# Patient Record
Sex: Male | Born: 1970 | Race: Black or African American | Hispanic: No | Marital: Married | Smoking: Current every day smoker
Health system: Southern US, Community
[De-identification: ages and names within clinical notes are randomized; demographics above are authoritative.]

---

## 2009-12-06 ENCOUNTER — Emergency Department (HOSPITAL_COMMUNITY): Admission: EM | Admit: 2009-12-06 | Discharge: 2009-12-07 | Payer: Self-pay | Admitting: Emergency Medicine

## 2010-05-17 LAB — POCT I-STAT, CHEM 8
Chloride: 105 mEq/L (ref 96–112)
Creatinine, Ser: 0.9 mg/dL (ref 0.4–1.5)
Glucose, Bld: 95 mg/dL (ref 70–99)
Hemoglobin: 15 g/dL (ref 13.0–17.0)
Potassium: 3.6 mEq/L (ref 3.5–5.1)
Sodium: 142 mEq/L (ref 135–145)

## 2010-05-17 LAB — POCT CARDIAC MARKERS
CKMB, poc: <1 ng/mL — ABNORMAL LOW (ref 1.0–8.0)
Myoglobin, poc: 43.4 ng/mL (ref 12–200)
Troponin i, poc: <0.05 ng/mL (ref 0.00–0.09)

## 2011-03-17 IMAGING — CR DG CHEST 2V
2 series · 2 of 2 positions shown · non-contrast
Comparison: None.

CLINICAL DATA: 39-year-old male with left chest pain.

CHEST - 2 VIEW

[w chest pa]
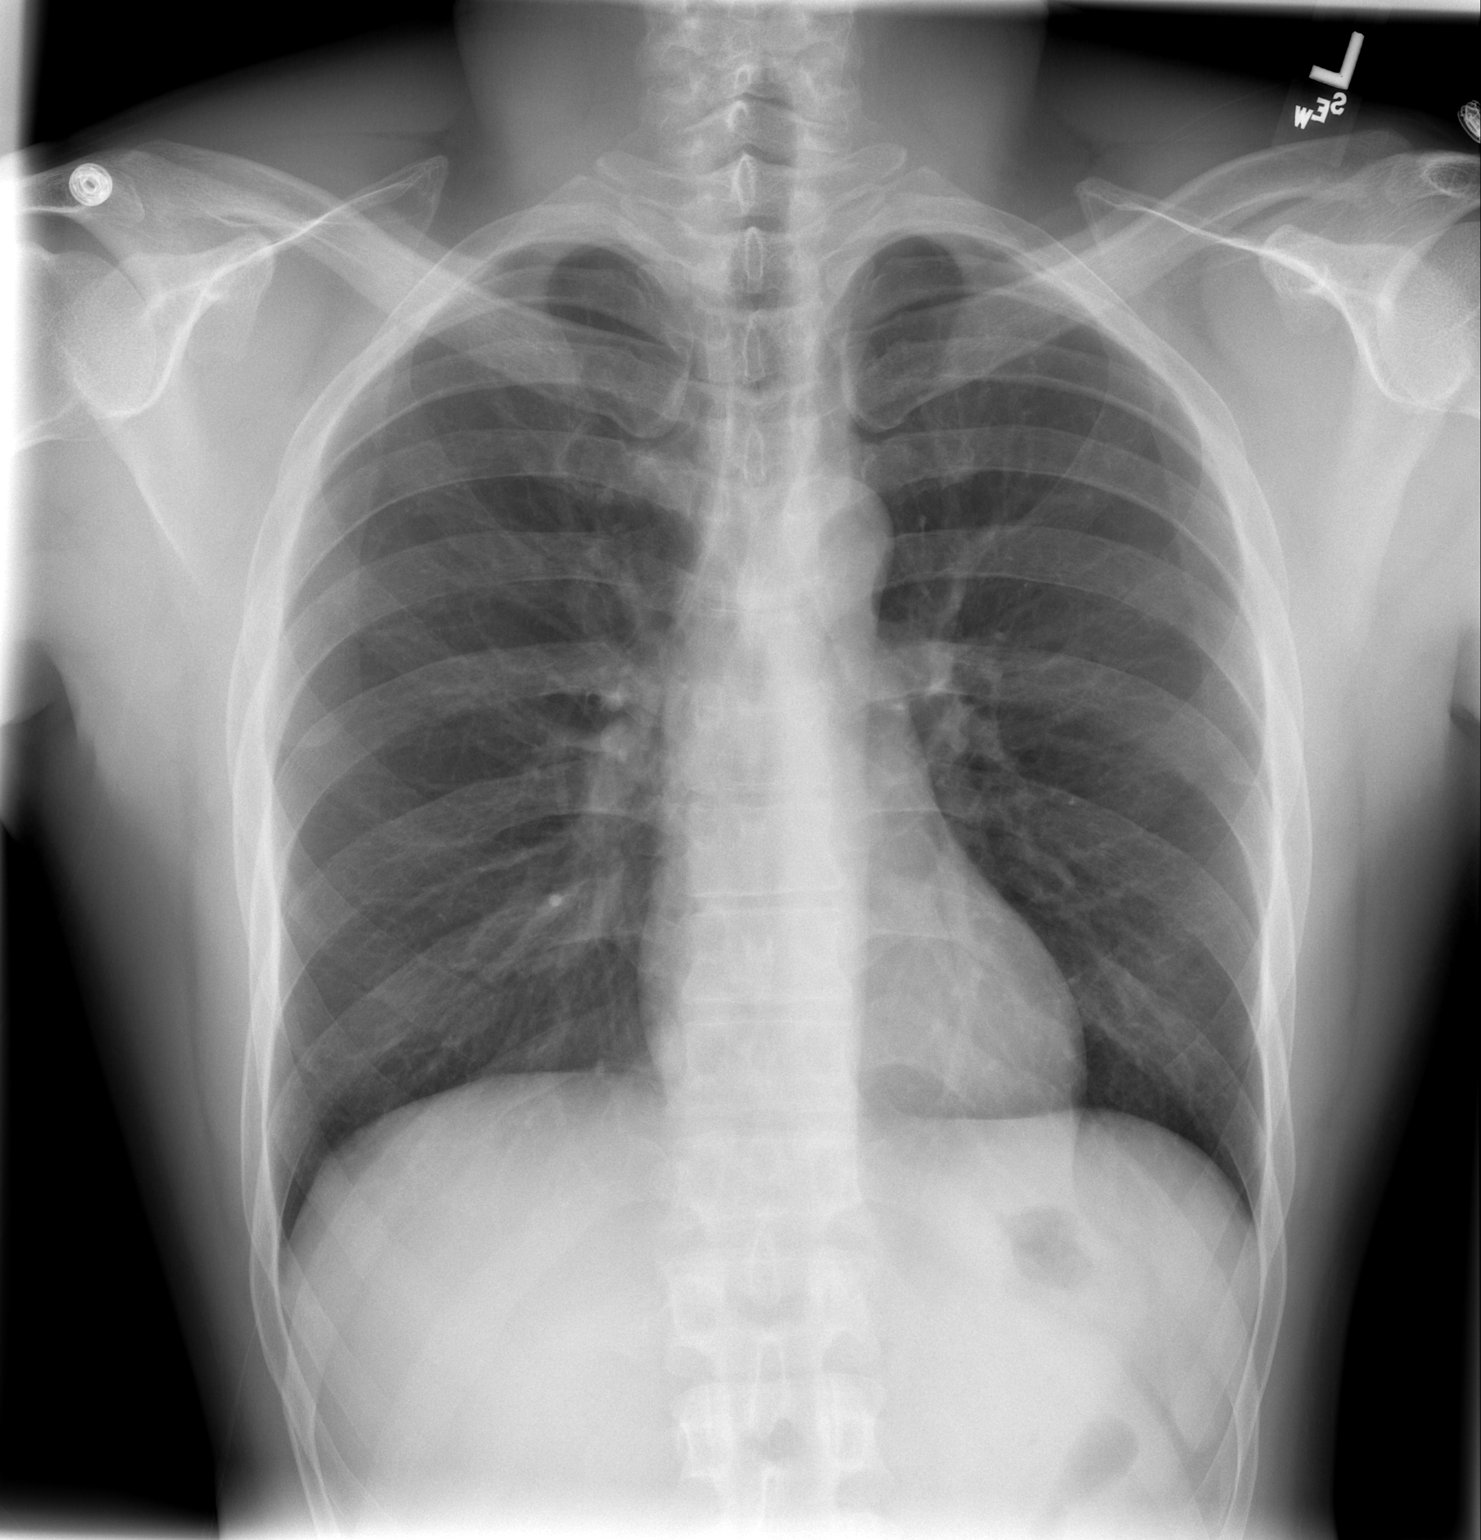

[w chest lat]
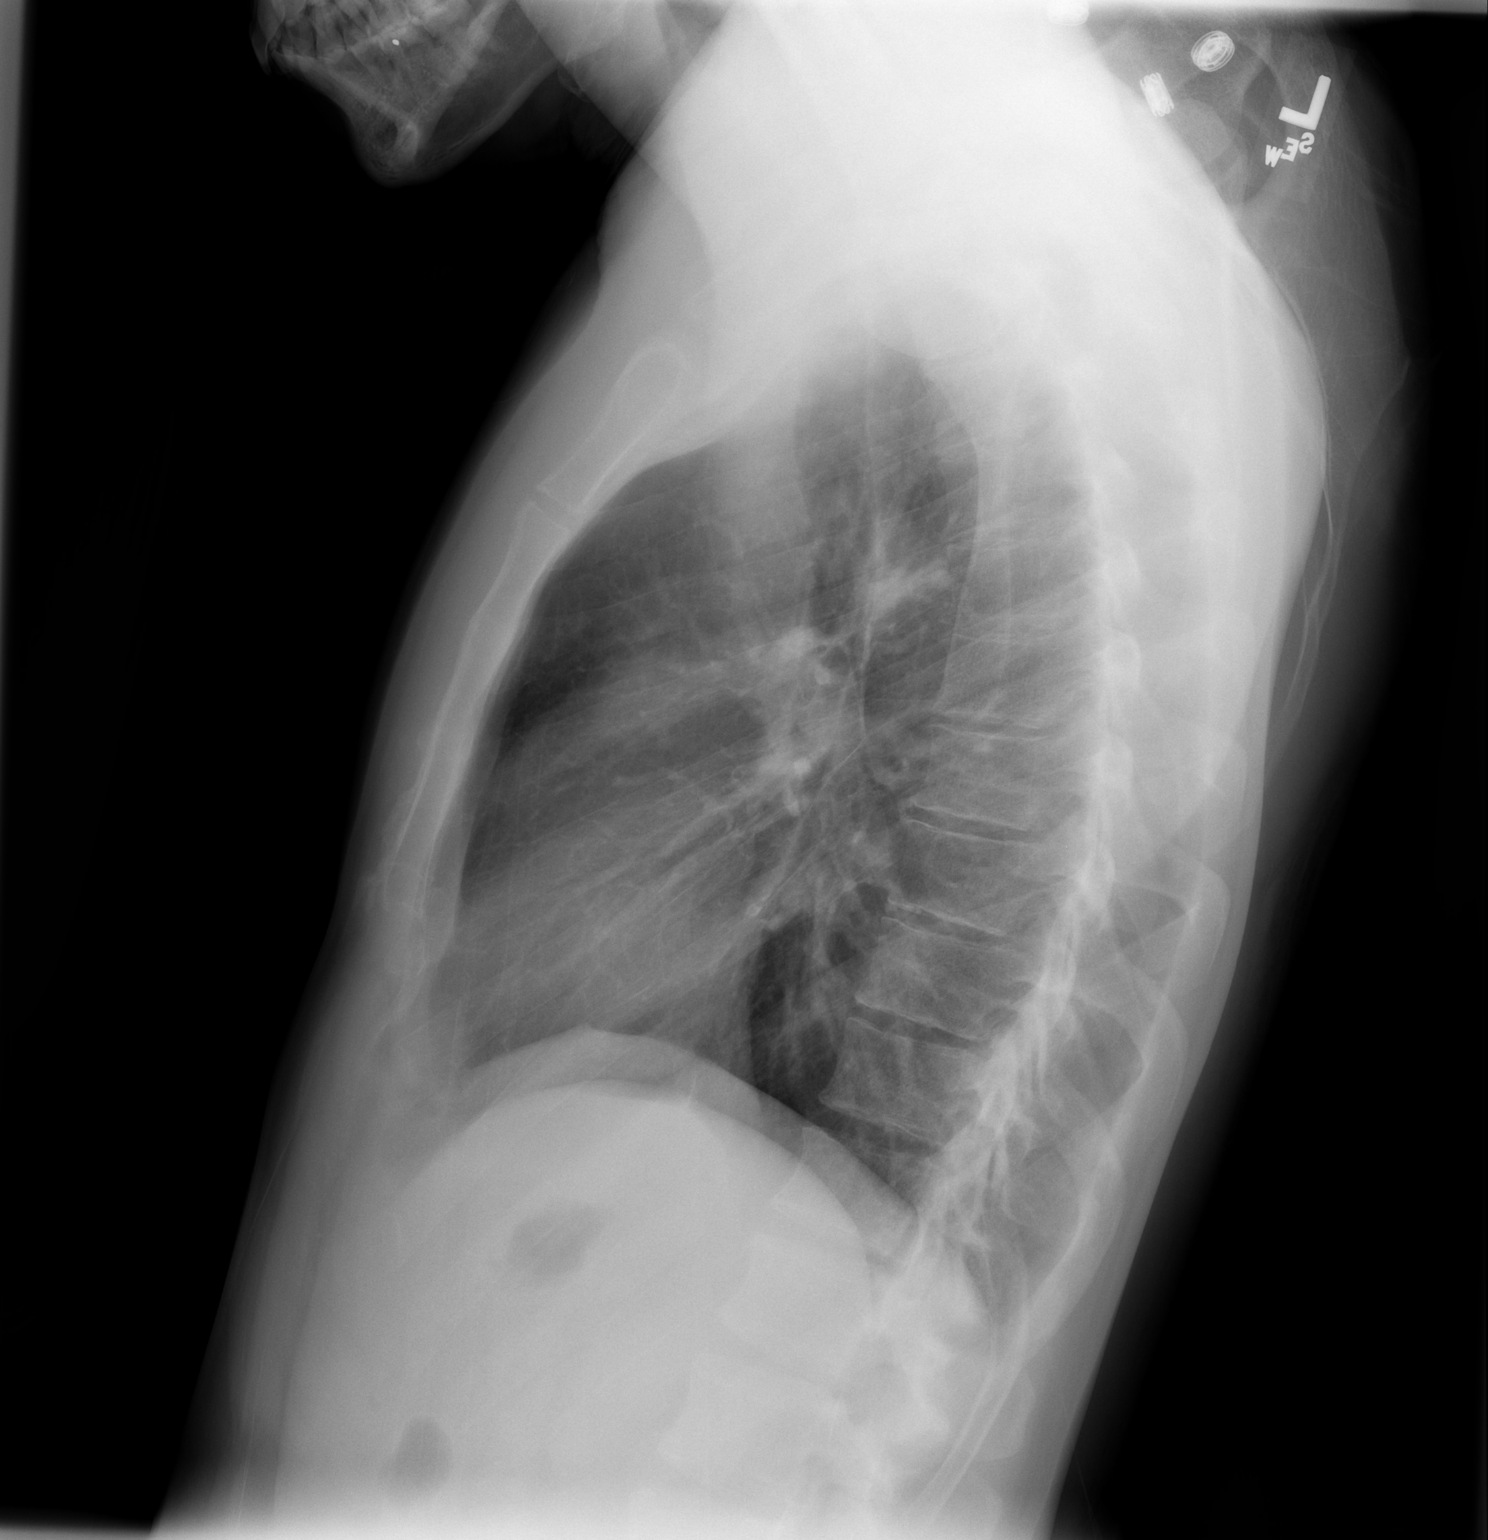

[2 of 2 positions shown; findings below may reference images not displayed]

FINDINGS: Normal lung volumes. Normal cardiac size and mediastinal
contours.  Visualized tracheal air column is within normal limits.
No pneumothorax.  The lungs are clear. No acute osseous abnormality
identified.
IMPRESSION: Negative, no acute cardiopulmonary abnormality.

## 2018-09-25 ENCOUNTER — Other Ambulatory Visit: Payer: Self-pay

## 2018-09-25 DIAGNOSIS — Z20822 Contact with and (suspected) exposure to covid-19: Secondary | ICD-10-CM

## 2018-09-28 LAB — NOVEL CORONAVIRUS, NAA: SARS-CoV-2, NAA: NOT DETECTED

## 2019-02-18 ENCOUNTER — Other Ambulatory Visit: Payer: Self-pay

## 2019-02-18 DIAGNOSIS — Z20822 Contact with and (suspected) exposure to covid-19: Secondary | ICD-10-CM

## 2019-02-20 LAB — NOVEL CORONAVIRUS, NAA: SARS-CoV-2, NAA: NOT DETECTED

## 2019-03-21 ENCOUNTER — Emergency Department (HOSPITAL_COMMUNITY): Payer: Self-pay

## 2019-03-21 ENCOUNTER — Emergency Department (HOSPITAL_COMMUNITY)
Admission: EM | Admit: 2019-03-21 | Discharge: 2019-03-22 | Disposition: A | Discharge status: Home / Self Care | Payer: Self-pay | Attending: Emergency Medicine | Admitting: Emergency Medicine

## 2019-03-21 ENCOUNTER — Encounter (HOSPITAL_COMMUNITY): Payer: Self-pay | Admitting: *Deleted

## 2019-03-21 ENCOUNTER — Other Ambulatory Visit: Payer: Self-pay

## 2019-03-21 DIAGNOSIS — Y929 Unspecified place or not applicable: Secondary | ICD-10-CM | POA: Insufficient documentation

## 2019-03-21 DIAGNOSIS — Y939 Activity, unspecified: Secondary | ICD-10-CM | POA: Insufficient documentation

## 2019-03-21 DIAGNOSIS — X500XXA Overexertion from strenuous movement or load, initial encounter: Secondary | ICD-10-CM | POA: Insufficient documentation

## 2019-03-21 DIAGNOSIS — S46912A Strain of unspecified muscle, fascia and tendon at shoulder and upper arm level, left arm, initial encounter: Secondary | ICD-10-CM | POA: Insufficient documentation

## 2019-03-21 DIAGNOSIS — F17210 Nicotine dependence, cigarettes, uncomplicated: Secondary | ICD-10-CM | POA: Insufficient documentation

## 2019-03-21 DIAGNOSIS — Y990 Civilian activity done for income or pay: Secondary | ICD-10-CM | POA: Insufficient documentation

## 2019-03-21 DIAGNOSIS — Z79899 Other long term (current) drug therapy: Secondary | ICD-10-CM | POA: Insufficient documentation

## 2019-03-21 NOTE — ED Triage Notes (Signed)
The pt is  C/o lt shoulder pain after he finished working 1400   The pain has continued

## 2019-03-22 MED ORDER — METHOCARBAMOL 500 MG PO TABS
500.0000 mg | ORAL_TABLET | Freq: Once | ORAL | Status: AC
  Administered 2019-03-22: 500 mg via ORAL
  Filled 2019-03-22: qty 1

## 2019-03-22 MED ORDER — NAPROXEN 500 MG PO TABS: 500.0000 mg | ORAL_TABLET | Freq: Two times a day (BID) | ORAL | 0 refills | Status: AC

## 2019-03-22 MED ORDER — METHOCARBAMOL 500 MG PO TABS: 500.0000 mg | ORAL_TABLET | Freq: Two times a day (BID) | ORAL | 0 refills | Status: AC | PRN

## 2019-03-22 MED ORDER — OXYCODONE-ACETAMINOPHEN 5-325 MG PO TABS
1.0000 | ORAL_TABLET | Freq: Once | ORAL | Status: AC
Start: 2019-03-22 — End: 2019-03-22
  Administered 2019-03-22: 05:00:00 1 via ORAL
  Filled 2019-03-22: qty 1

## 2019-03-22 MED ORDER — LIDOCAINE 5 % EX PTCH
1.0000 | MEDICATED_PATCH | CUTANEOUS | Status: DC
  Administered 2019-03-22: 1 via TRANSDERMAL
  Filled 2019-03-22: qty 1

## 2019-03-22 MED ORDER — KETOROLAC TROMETHAMINE 30 MG/ML IJ SOLN
30.0000 mg | Freq: Once | INTRAMUSCULAR | Status: AC
  Administered 2019-03-22: 05:00:00 30 mg via INTRAMUSCULAR
  Filled 2019-03-22: qty 1
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
## 2019-03-22 NOTE — ED Notes (Signed)
Discharge instructions, including pain management and follow up with ortho surgery, discussed with pt. Pt verbalized understanding with no questions at this time. Pt. to go home with family.

## 2019-03-22 NOTE — Discharge Instructions (Signed)
Alternate ice and heat to areas of injury 3-4 times per day to limit inflammation and spasm.  Avoid strenuous activity and heavy lifting.  We recommend consistent use of naproxen in addition to Robaxin for muscle spasms.  Do not drive or drink alcohol after taking Robaxin as it may make you drowsy and impair your judgment.  We recommend follow-up with an Orthopedist to ensure resolution of symptoms.  Return to the ED for any new or concerning symptoms.

## 2020-06-28 IMAGING — CR DG SHOULDER 2+V*L*
3 series · 3 of 3 positions shown · non-contrast
Comparison: None.

CLINICAL DATA: Left shoulder pain. Pain after moving furniture.

EXAM:
LEFT SHOULDER - 2+ VIEW

[shoulder grashey]
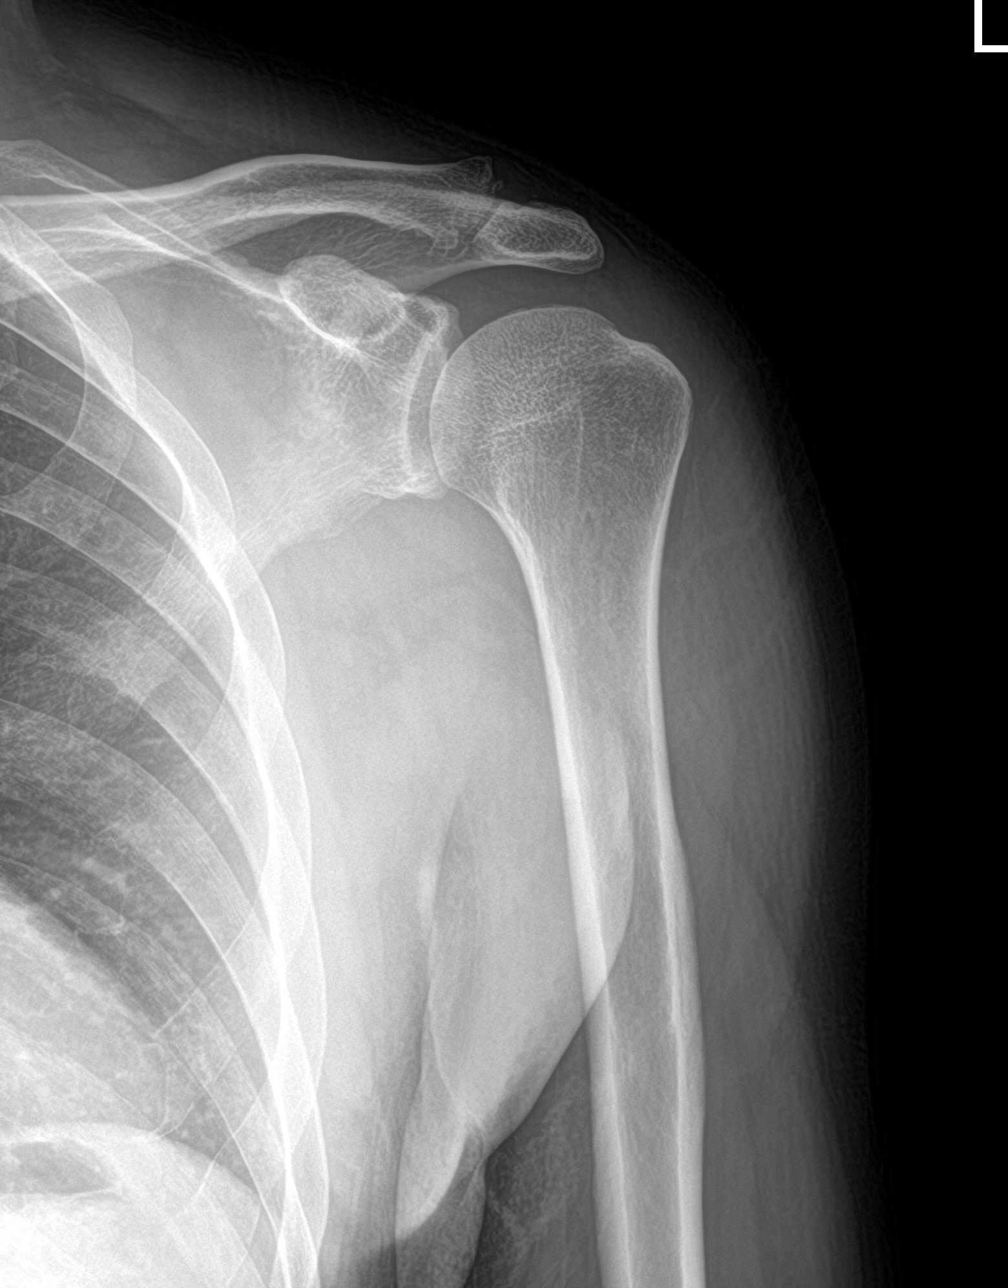

[shoulder y view]
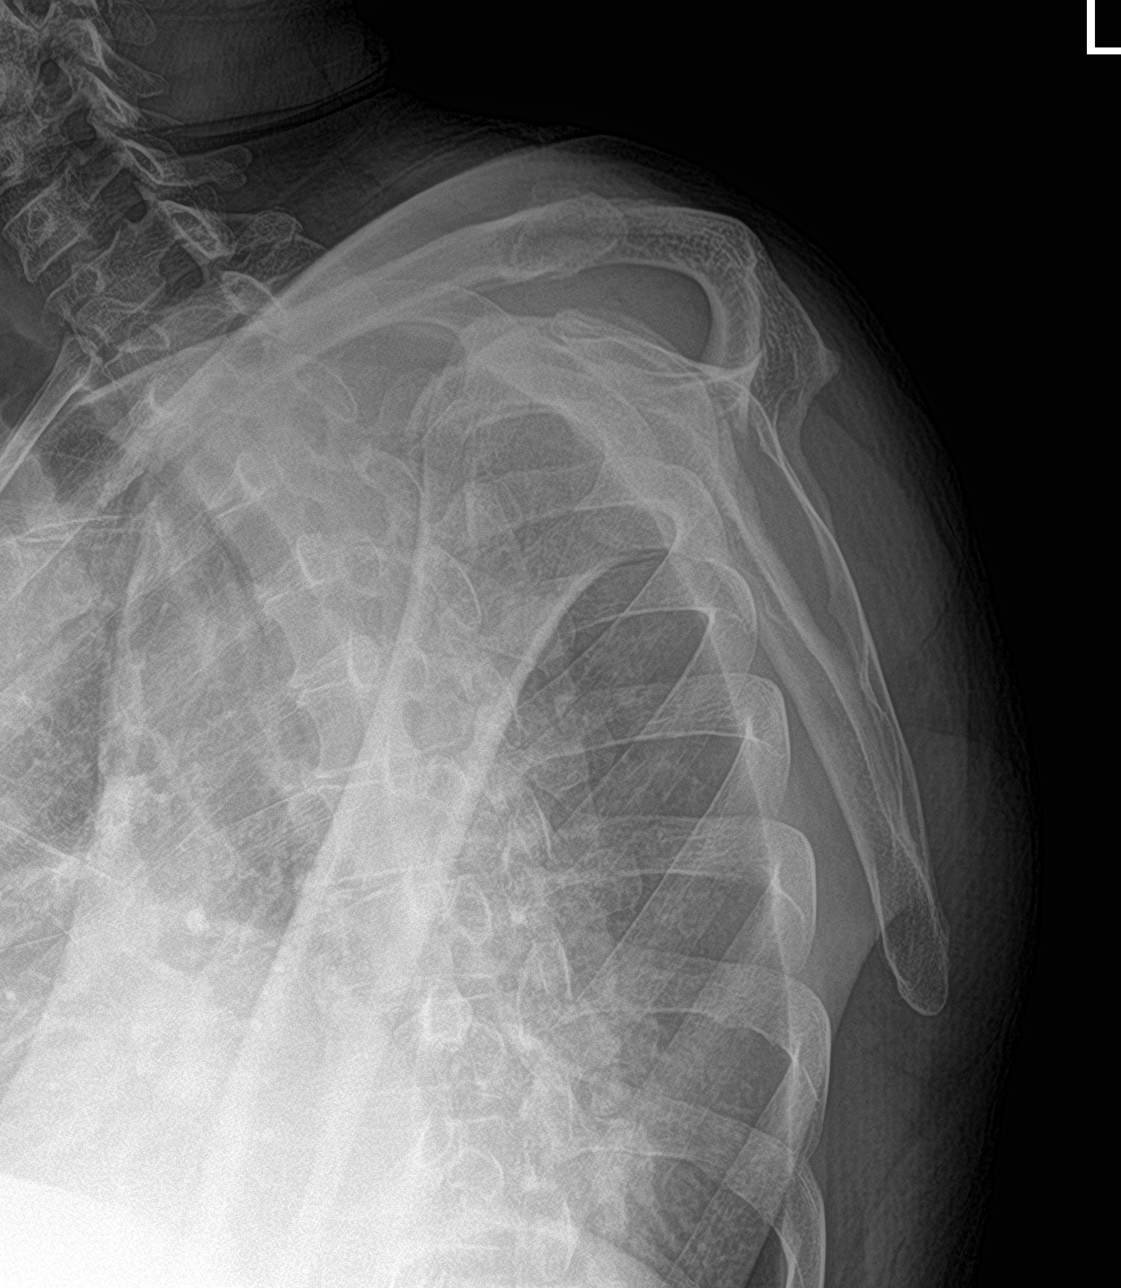

[shoulder axillary]
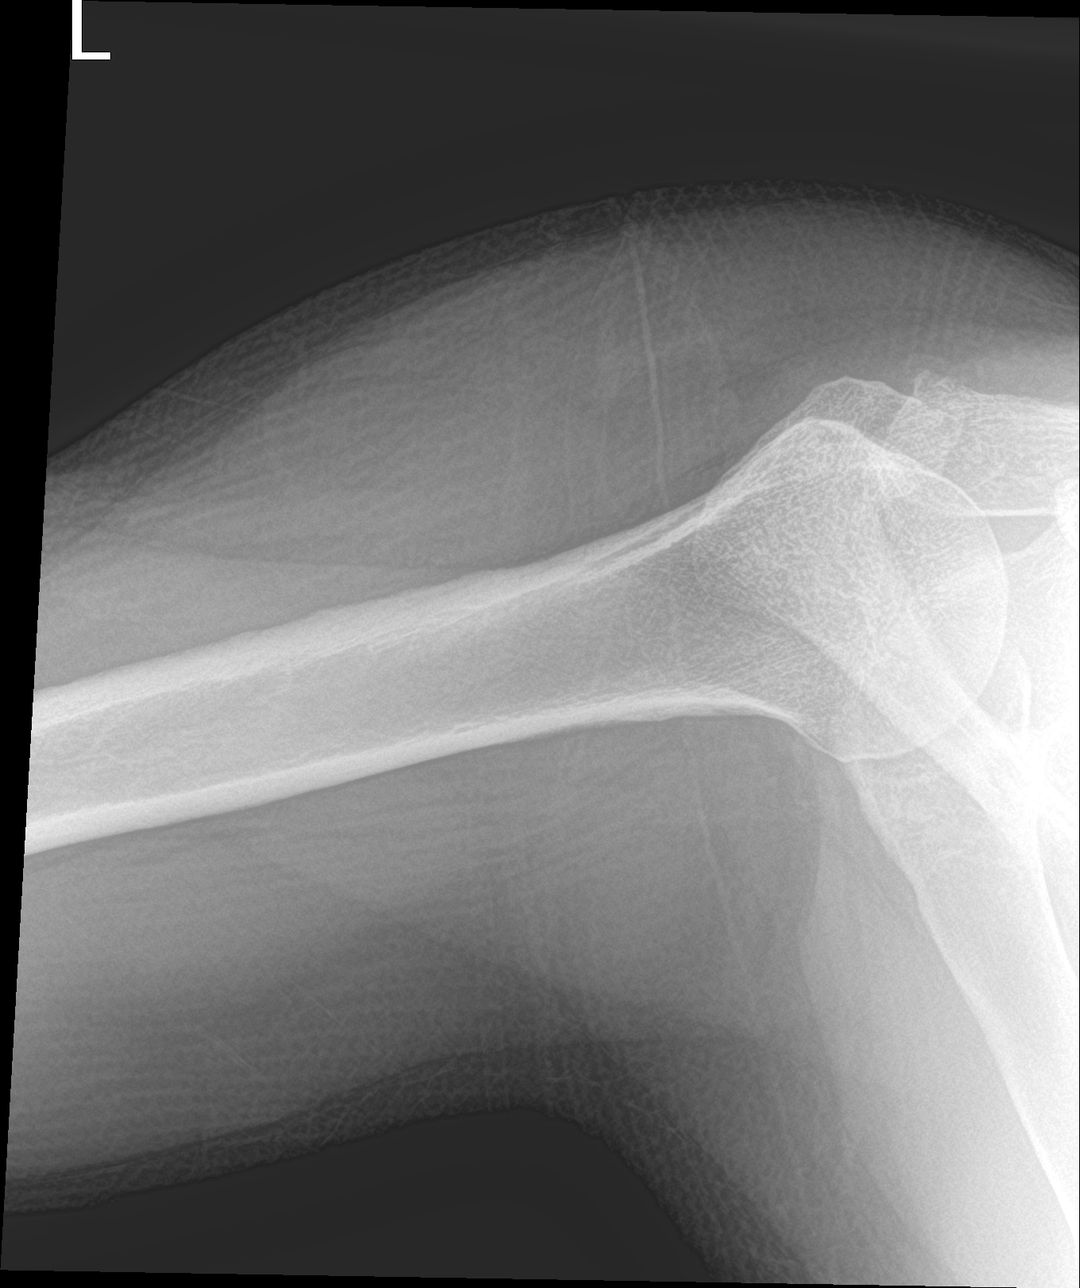

[3 of 3 positions shown; findings below may reference images not displayed]

FINDINGS: There is no evidence of fracture or dislocation. Mild
acromioclavicular degenerative change with small inferiorly directed
osteophytes. Trace inferior glenoid spurring. Soft tissues are
unremarkable. No abnormal soft tissue calcifications.
IMPRESSION: Mild osteoarthritis of the acromioclavicular and glenohumeral
joints. No acute bony abnormality.
